# Patient Record
Sex: Female | Born: 2005 | Race: White | Hispanic: No | Marital: Single | Smoking: Never smoker
Health system: Southern US, Community
[De-identification: ages and names within clinical notes are randomized; demographics above are authoritative.]

---
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
## 2023-01-16 ENCOUNTER — Encounter: Payer: Self-pay | Admitting: Dermatology

## 2023-01-16 ENCOUNTER — Ambulatory Visit: Payer: Managed Care, Other (non HMO) | Admitting: Dermatology

## 2023-01-16 DIAGNOSIS — D2262 Melanocytic nevi of left upper limb, including shoulder: Secondary | ICD-10-CM

## 2023-01-16 DIAGNOSIS — Q828 Other specified congenital malformations of skin: Secondary | ICD-10-CM

## 2023-01-16 DIAGNOSIS — L814 Other melanin hyperpigmentation: Secondary | ICD-10-CM | POA: Diagnosis not present

## 2023-01-16 DIAGNOSIS — L689 Hypertrichosis, unspecified: Secondary | ICD-10-CM | POA: Diagnosis not present

## 2023-01-16 DIAGNOSIS — D485 Neoplasm of uncertain behavior of skin: Secondary | ICD-10-CM

## 2023-01-16 NOTE — Progress Notes (Signed)
   New Patient Visit   Subjective  Samantha Craig is a 17 y.o. female who presents for the following: Patient presents for changing mole on the left back.  She also has other moles she would like checked. The patient has spots, moles and lesions to be evaluated, some may be new or changing and the patient may have concern these could be cancer.  The following portions of the chart were reviewed this encounter and updated as appropriate: medications, allergies, medical history  Review of Systems:  No other skin or systemic complaints except as noted in HPI or Assessment and Plan.  Objective  Well appearing patient in no apparent distress; mood and affect are within normal limits. A focused examination was performed of the following areas: Scalp, back, arms Relevant exam findings are noted in the Assessment and Plan.  Left medial scapula 0.7 brown papule      Assessment & Plan   Congenital Nevus with hypetrichosis Exam: 0.8 x 0.6 cm brown papule with hypertrichosis See photo Benign-appearing.  Observation.  Call clinic for new or changing lesions.  Recommend daily use of broad spectrum spf 30+ sunscreen to sun-exposed areas.        Neoplasm of uncertain behavior of skin -changing mole Left medial scapula  Epidermal / dermal shaving  Lesion diameter (cm):  0.7 Informed consent: discussed and consent obtained   Timeout: patient name, date of birth, surgical site, and procedure verified   Procedure prep:  Patient was prepped and draped in usual sterile fashion Prep type:  Isopropyl alcohol Anesthesia: the lesion was anesthetized in a standard fashion   Anesthetic:  1% lidocaine w/ epinephrine 1-100,000 buffered w/ 8.4% NaHCO3 Instrument used: flexible razor blade   Hemostasis achieved with: pressure, aluminum chloride and electrodesiccation   Outcome: patient tolerated procedure well   Post-procedure details: sterile dressing applied and wound care instructions given    Dressing type: bandage and petrolatum    Specimen 1 - Surgical pathology Differential Diagnosis: Nevus vs dysplastic nevus Check Margins: No  LENTIGINES Exam: scattered tan macules Due to sun exposure Treatment Plan: Benign-appearing, observe. Recommend daily broad spectrum sunscreen SPF 30+ to sun-exposed areas, reapply every 2 hours as needed.  Call for any changes  Return if symptoms worsen or fail to improve.  I, Joanie Coddington, CMA, am acting as scribe for Armida Sans, MD .  Documentation: I have reviewed the above documentation for accuracy and completeness, and I agree with the above.  Armida Sans, MD

## 2023-01-16 NOTE — Patient Instructions (Signed)
Shave Excision Benign Lesion Wound Care Instructions  Leave the original bandage on for 24 hours if possible.  If the bandage becomes soaked or soiled before that time, it is OK to remove it and examine the wound.  A small amount of post-operative bleeding is normal.  If excessive bleeding occurs, remove the bandage, place gauze over the site and apply continuous pressure (no peeking) over the area for 20-30 minutes.  If this does not stop the bleeding, try again for 40 minutes.  If this does not work, please call our clinic as soon as possible (even if after-hours).    Twice a day, cleanse the wound with soap and water.  If a thick crust develops you may use a Q-tip dipped into dilute hydrogen peroxide (mix 1:1 with water) to dissolve it.  Hydrogen peroxide can slow the healing process, so use it only as needed.  After washing, apply Vaseline jelly or Polysporin ointment.  For best healing, the wound should be covered with a layer of ointment at all times.  This may mean re-applying the ointment several times a day.  For open wounds, continue until it has healed.    If you have any swelling, keep the area elevated.  Some redness, tenderness and white or yellow material in the wound is normal healing.  If the area becomes very sore and red, or develops a thick yellow-green material (pus), it may be infected; please notify us.    Wound healing continues for up to one year following surgery.  It is not unusual to experience pain in the scar from time to time during the interval.  If the pain becomes severe or the scar thickens, you should notify the office.  A slight amount of redness in a scar is expected for the first six months.  After six months, the redness subsides and the scar will soften and fade.  The color difference becomes less noticeable with time.  If there are any problems, return for a post-op surgery check at your earliest convenience.  Please call our office for any questions or  concerns.  Due to recent changes in healthcare laws, you may see results of your pathology and/or laboratory studies on MyChart before the doctors have had a chance to review them. We understand that in some cases there may be results that are confusing or concerning to you. Please understand that not all results are received at the same time and often the doctors may need to interpret multiple results in order to provide you with the best plan of care or course of treatment. Therefore, we ask that you please give us 2 business days to thoroughly review all your results before contacting the office for clarification. Should we see a critical lab result, you will be contacted sooner.   If You Need Anything After Your Visit  If you have any questions or concerns for your doctor, please call our main line at [phone] and press option 4 to reach your doctor's medical assistant. If no one answers, please leave a voicemail as directed and we will return your call as soon as possible. Messages left after 4 pm will be answered the following business day.   You may also send us a message via MyChart. We typically respond to MyChart messages within 1-2 business days.  For prescription refills, please ask your pharmacy to contact our office. Our fax number is [phone].  If you have an urgent issue when the clinic is closed that   cannot wait until the next business day, you can page your doctor at the number below.    Please note that while we do our best to be available for urgent issues outside of office hours, we are not available 24/7.   If you have an urgent issue and are unable to reach us, you may choose to seek medical care at your doctor's office, retail clinic, urgent care center, or emergency room.  If you have a medical emergency, please immediately call 911 or go to the emergency department.  Pager Numbers  - Dr. Kowalski: [phone]  - Dr. Moye: [phone]  - Dr. Stewart:  [phone]  In the event of inclement weather, please call our main line at [phone] for an update on the status of any delays or closures.  Dermatology Medication Tips: Please keep the boxes that topical medications come in in order to help keep track of the instructions about where and how to use these. Pharmacies typically print the medication instructions only on the boxes and not directly on the medication tubes.   If your medication is too expensive, please contact our office at [phone] option 4 or send us a message through MyChart.   We are unable to tell what your co-pay for medications will be in advance as this is different depending on your insurance coverage. However, we may be able to find a substitute medication at lower cost or fill out paperwork to get insurance to cover a needed medication.   If a prior authorization is required to get your medication covered by your insurance company, please allow us 1-2 business days to complete this process.  Drug prices often vary depending on where the prescription is filled and some pharmacies may offer cheaper prices.  The website www.goodrx.com contains coupons for medications through different pharmacies. The prices here do not account for what the cost may be with help from insurance (it may be cheaper with your insurance), but the website can give you the price if you did not use any insurance.  - You can print the associated coupon and take it with your prescription to the pharmacy.  - You may also stop by our office during regular business hours and pick up a GoodRx coupon card.  - If you need your prescription sent electronically to a different pharmacy, notify our office through [place] MyChart or by phone at [phone] option 4.     Si Usted Necesita Algo Despus de Su Visita  Tambin puede enviarnos un mensaje a travs de MyChart. Por lo general respondemos a los mensajes de MyChart en el transcurso de 1 a 2  das hbiles.  Para renovar recetas, por favor pida a su farmacia que se ponga en contacto con nuestra oficina. Nuestro nmero de fax es el [phone].  Si tiene un asunto urgente cuando la clnica est cerrada y que no puede esperar hasta el siguiente da hbil, puede llamar/localizar a su doctor(a) al nmero que aparece a continuacin.   Por favor, tenga en cuenta que aunque hacemos todo lo posible para estar disponibles para asuntos urgentes fuera del horario de oficina, no estamos disponibles las 24 horas del da, los 7 das de la semana.   Si tiene un problema urgente y no puede comunicarse con nosotros, puede optar por buscar atencin mdica  en el consultorio de su doctor(a), en una clnica privada, en un centro de atencin urgente o en una sala de emergencias.  Si tiene una emergencia mdica, por favor   llame inmediatamente al 911 o vaya a la sala de emergencias.  Nmeros de bper  - Dr. Kowalski: [phone]  - Dra. Moye: [phone]  - Dra. Stewart: [phone]  En caso de inclemencias del tiempo, por favor llame a nuestra lnea principal al [phone] para una actualizacin sobre el estado de cualquier retraso o cierre.  Consejos para la medicacin en dermatologa: Por favor, guarde las cajas en las que vienen los medicamentos de uso tpico para ayudarle a seguir las instrucciones sobre dnde y cmo usarlos. Las farmacias generalmente imprimen las instrucciones del medicamento slo en las cajas y no directamente en los tubos del medicamento.   Si su medicamento es muy caro, por favor, pngase en contacto con nuestra oficina llamando al [phone] y presione la opcin 4 o envenos un mensaje a travs de MyChart.   No podemos decirle cul ser su copago por los medicamentos por adelantado ya que esto es diferente dependiendo de la cobertura de su seguro. Sin embargo, es posible que podamos encontrar un medicamento sustituto a menor costo o llenar un formulario para que el  seguro cubra el medicamento que se considera necesario.   Si se requiere una autorizacin previa para que su compaa de seguros cubra su medicamento, por favor permtanos de 1 a 2 das hbiles para completar este proceso.  Los precios de los medicamentos varan con frecuencia dependiendo del lugar de dnde se surte la receta y alguna farmacias pueden ofrecer precios ms baratos.  El sitio web www.goodrx.com tiene cupones para medicamentos de diferentes farmacias. Los precios aqu no tienen en cuenta lo que podra costar con la ayuda del seguro (puede ser ms barato con su seguro), pero el sitio web puede darle el precio si no utiliz ningn seguro.  - Puede imprimir el cupn correspondiente y llevarlo con su receta a la farmacia.  - Tambin puede pasar por nuestra oficina durante el horario de atencin regular y recoger una tarjeta de cupones de GoodRx.  - Si necesita que su receta se enve electrnicamente a una farmacia diferente, informe a nuestra oficina a travs de MyChart de [place] o por telfono llamando al [phone] y presione la opcin 4.  

## 2023-01-18 ENCOUNTER — Telehealth: Payer: Self-pay

## 2023-01-18 NOTE — Telephone Encounter (Signed)
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

## 2023-01-18 NOTE — Telephone Encounter (Signed)
Advised patient's mother of bx result/sh
